# Patient Record
Sex: Female | Born: 1985 | Race: Black or African American | Hispanic: No | Marital: Single | Smoking: Never smoker
Health system: Southern US, Community
[De-identification: ages and names within clinical notes are randomized; demographics above are authoritative.]

## PROBLEM LIST (undated history)

## (undated) DIAGNOSIS — F329 Major depressive disorder, single episode, unspecified: Secondary | ICD-10-CM

## (undated) DIAGNOSIS — F419 Anxiety disorder, unspecified: Secondary | ICD-10-CM

## (undated) DIAGNOSIS — F32A Depression, unspecified: Secondary | ICD-10-CM

## (undated) HISTORY — DX: Anxiety disorder, unspecified: F41.9

## (undated) HISTORY — DX: Depression, unspecified: F32.A

## (undated) HISTORY — DX: Major depressive disorder, single episode, unspecified: F32.9

---
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
## 2007-06-13 ENCOUNTER — Emergency Department (HOSPITAL_COMMUNITY): Admission: EM | Admit: 2007-06-13 | Discharge: 2007-06-13 | Payer: Self-pay | Admitting: Emergency Medicine

## 2008-06-16 ENCOUNTER — Emergency Department (HOSPITAL_COMMUNITY): Admission: EM | Admit: 2008-06-16 | Discharge: 2008-06-16 | Payer: Self-pay | Admitting: Emergency Medicine

## 2009-11-29 ENCOUNTER — Emergency Department (HOSPITAL_COMMUNITY): Admission: EM | Admit: 2009-11-29 | Discharge: 2009-11-29 | Payer: Self-pay | Admitting: Emergency Medicine

## 2010-05-30 ENCOUNTER — Inpatient Hospital Stay (INDEPENDENT_AMBULATORY_CARE_PROVIDER_SITE_OTHER)
Admission: RE | Admit: 2010-05-30 | Discharge: 2010-05-30 | Disposition: A | Discharge status: Home / Self Care | Payer: Self-pay | Source: Ambulatory Visit | Attending: Emergency Medicine | Admitting: Emergency Medicine

## 2010-05-30 DIAGNOSIS — N390 Urinary tract infection, site not specified: Secondary | ICD-10-CM

## 2010-05-30 LAB — POCT URINALYSIS DIPSTICK
Specific Gravity, Urine: 1.015 (ref 1.005–1.030)
pH: 5.5 (ref 5.0–8.0)

## 2010-05-31 LAB — URINE CULTURE: Colony Count: >=100000

## 2010-06-17 LAB — POCT I-STAT, CHEM 8
BUN: 10 mg/dL (ref 6–23)
Calcium, Ion: 1.13 mmol/L (ref 1.12–1.32)
Chloride: 103 mEq/L (ref 96–112)
Creatinine, Ser: 1.2 mg/dL (ref 0.4–1.2)
Glucose, Bld: 101 mg/dL — ABNORMAL HIGH (ref 70–99)
HCT: 43 % (ref 36.0–46.0)
Hemoglobin: 14.6 g/dL (ref 12.0–15.0)
TCO2: 27 mmol/L (ref 0–100)

## 2010-07-14 LAB — POCT RAPID STREP A (OFFICE): Streptococcus, Group A Screen (Direct): NEGATIVE

## 2010-10-03 ENCOUNTER — Inpatient Hospital Stay (INDEPENDENT_AMBULATORY_CARE_PROVIDER_SITE_OTHER)
Admission: RE | Admit: 2010-10-03 | Discharge: 2010-10-03 | Disposition: A | Discharge status: Home / Self Care | Payer: Self-pay | Source: Ambulatory Visit | Attending: Family Medicine | Admitting: Family Medicine

## 2010-10-03 DIAGNOSIS — L02219 Cutaneous abscess of trunk, unspecified: Secondary | ICD-10-CM

## 2010-10-06 LAB — CULTURE, ROUTINE-ABSCESS

## 2010-12-26 LAB — POCT PREGNANCY, URINE
Operator id: 257131
Preg Test, Ur: NEGATIVE

## 2011-05-26 ENCOUNTER — Other Ambulatory Visit: Payer: Self-pay | Admitting: Physician Assistant

## 2011-05-26 ENCOUNTER — Other Ambulatory Visit (HOSPITAL_COMMUNITY)
Admission: RE | Admit: 2011-05-26 | Discharge: 2011-05-26 | Disposition: A | Discharge status: Home / Self Care | Payer: Self-pay | Source: Ambulatory Visit | Attending: Family Medicine | Admitting: Family Medicine

## 2011-05-26 DIAGNOSIS — Z01419 Encounter for gynecological examination (general) (routine) without abnormal findings: Secondary | ICD-10-CM | POA: Insufficient documentation

## 2011-08-29 ENCOUNTER — Encounter (HOSPITAL_COMMUNITY): Payer: Self-pay | Admitting: Emergency Medicine

## 2011-08-29 ENCOUNTER — Emergency Department (INDEPENDENT_AMBULATORY_CARE_PROVIDER_SITE_OTHER)
Admission: EM | Admit: 2011-08-29 | Discharge: 2011-08-29 | Disposition: A | Discharge status: Home / Self Care | Payer: Self-pay | Source: Home / Self Care | Attending: Emergency Medicine | Admitting: Emergency Medicine

## 2011-08-29 DIAGNOSIS — K5289 Other specified noninfective gastroenteritis and colitis: Secondary | ICD-10-CM

## 2011-08-29 DIAGNOSIS — K529 Noninfective gastroenteritis and colitis, unspecified: Secondary | ICD-10-CM

## 2011-08-29 LAB — POCT PREGNANCY, URINE: Preg Test, Ur: NEGATIVE

## 2011-08-29 LAB — POCT URINALYSIS DIP (DEVICE)
Glucose, UA: NEGATIVE mg/dL
Hgb urine dipstick: NEGATIVE
Ketones, ur: NEGATIVE mg/dL
Urobilinogen, UA: 0.2 mg/dL (ref 0.0–1.0)

## 2011-08-29 MED ORDER — ONDANSETRON HCL 4 MG PO TABS: 4.0000 mg | ORAL_TABLET | Freq: Four times a day (QID) | ORAL | Status: AC

## 2011-08-29 NOTE — ED Notes (Signed)
Pt having vomiting and diarrhea since last night after eating chick fil a and having a glass of wine. Pt states she was not able to keep things down, but this morning was able to keep down a little bit of water and pepsi. Pt states it is mostly clear liquid and dry heaves. Pt states she is 2 months late on her period but this has happened in the past and she took a pregnancy test which was negative.

## 2011-08-29 NOTE — Discharge Instructions (Signed)
Clear liquids only until 24 hours after the last time you vomited (so tomorrow morning if you don't throw up again).  Then you can add solid foods that are simple (crackers, plain toast, white rice, applesauce, etc).  Add greasy, high fat, spicy foods and dairy back to your diet last.    Clear Liquid Diet The clear liquid dietconsists of foods that are liquid or will become liquid at room temperature.You should be able to see through the liquid and beverages. Examples of foods allowed on a clear liquid diet include fruit juice, broth or bouillon, gelatin, or frozen ice pops. The purpose of this diet is to provide necessary fluid, electrolytes such as sodium and potassium, and energy to keep the body functioning during times when you are not able to consume a regular diet.A clear liquid diet should not be continued for long periods of time as it is not nutritionally adequate.  REASONS FOR USING A CLEAR LIQUID DIET  In sudden onset (acute) conditions for a patient before or after surgery.   As the first step in oral feeding.   For fluid and electrolyte replacement in diarrheal diseases.   As a diet before certain medical tests are performed.  ADEQUACY The clear liquid diet is adequate only in ascorbic acid, according to the Recommended Dietary Allowances of the [employer]. CHOOSING FOODS Breads and Starches  Allowed:  None are allowed.   Avoid: All are avoided.  Vegetables  Allowed:  Strained tomato or vegetable juice.   Avoid: Any others.  Fruit  Allowed:  Strained fruit juices and fruit drinks. Include 1 serving of citrus or vitamin C-enriched fruit juice daily.   Avoid: Any others.  Meat and Meat Substitutes  Allowed:  None are allowed.   Avoid: All are avoided.  Milk  Allowed:  None are allowed.   Avoid: All are avoided.  Soups and Combination Foods  Allowed:  Clear bouillon, broth, or strained broth-based soups.   Avoid: Any others.  Desserts and  Sweets  Allowed:  Sugar, honey. High protein gelatin. Flavored gelatin, ices, or frozen ice pops that do not contain milk.   Avoid: Any others.  Fats and Oils  Allowed:  None are allowed.   Avoid: All are avoided.  Beverages  Allowed: Cereal beverages, coffee (regular or decaffeinated), tea, or soda at the discretion of your caregiver.   Avoid: Any others.  Condiments  Allowed:  Iodized salt.   Avoid: Any others, including pepper.  Supplements  Allowed:  Liquid nutrition beverages.   Avoid: Any others that contain lactose or fiber.  SAMPLE MEAL PLAN Breakfast  4 oz (120 mL) strained orange juice.    to 1 cup (125 to 250 mL) gelatin (plain or fortified).   1 cup (250 mL) beverage (coffee or tea).   Sugar, if desired.  Midmorning Snack   cup (125 mL) gelatin (plain or fortified).  Lunch  1 cup (250 mL) broth or consomm.   4 oz (120 mL) strained grapefruit juice.    cup (125 mL) gelatin (plain or fortified).   1 cup (250 mL) beverage (coffee or tea).   Sugar, if desired.  Midafternoon Snack   cup (125 mL) fruit ice.    cup (125 mL) strained fruit juice.  Dinner  1 cup (250 mL) broth or consomm.    cup (125 mL) cranberry juice.    cup (125 mL) flavored gelatin (plain or fortified).   1 cup (250 mL) beverage (coffee or tea).  Sugar, if desired.  Evening Snack  4 oz (120 mL) strained apple juice (vitamin C-fortified).    cup (125 mL) flavored gelatin (plain or fortified).  Document Released: 03/20/2005 Document Revised: 03/09/2011 Document Reviewed: 06/17/2010 [facility] Patient Information 2012 [place], [place].Clear Liquid Diet The clear liquid dietconsists of foods that are liquid or will become liquid at room temperature.You should be able to see through the liquid and beverages. Examples of foods allowed on a clear liquid diet include fruit juice, broth or bouillon, gelatin, or frozen ice pops. The purpose of this diet is to provide  necessary fluid, electrolytes such as sodium and potassium, and energy to keep the body functioning during times when you are not able to consume a regular diet.A clear liquid diet should not be continued for long periods of time as it is not nutritionally adequate.  REASONS FOR USING A CLEAR LIQUID DIET  In sudden onset (acute) conditions for a patient before or after surgery.   As the first step in oral feeding.   For fluid and electrolyte replacement in diarrheal diseases.   As a diet before certain medical tests are performed.  ADEQUACY The clear liquid diet is adequate only in ascorbic acid, according to the Recommended Dietary Allowances of the [employer]. CHOOSING FOODS Breads and Starches  Allowed:  None are allowed.   Avoid: All are avoided.  Vegetables  Allowed:  Strained tomato or vegetable juice.   Avoid: Any others.  Fruit  Allowed:  Strained fruit juices and fruit drinks. Include 1 serving of citrus or vitamin C-enriched fruit juice daily.   Avoid: Any others.  Meat and Meat Substitutes  Allowed:  None are allowed.   Avoid: All are avoided.  Milk  Allowed:  None are allowed.   Avoid: All are avoided.  Soups and Combination Foods  Allowed:  Clear bouillon, broth, or strained broth-based soups.   Avoid: Any others.  Desserts and Sweets  Allowed:  Sugar, honey. High protein gelatin. Flavored gelatin, ices, or frozen ice pops that do not contain milk.   Avoid: Any others.  Fats and Oils  Allowed:  None are allowed.   Avoid: All are avoided.  Beverages  Allowed: Cereal beverages, coffee (regular or decaffeinated), tea, or soda at the discretion of your caregiver.   Avoid: Any others.  Condiments  Allowed:  Iodized salt.   Avoid: Any others, including pepper.  Supplements  Allowed:  Liquid nutrition beverages.   Avoid: Any others that contain lactose or fiber.  SAMPLE MEAL PLAN Breakfast  4 oz (120 mL) strained orange  juice.    to 1 cup (125 to 250 mL) gelatin (plain or fortified).   1 cup (250 mL) beverage (coffee or tea).   Sugar, if desired.  Midmorning Snack   cup (125 mL) gelatin (plain or fortified).  Lunch  1 cup (250 mL) broth or consomm.   4 oz (120 mL) strained grapefruit juice.    cup (125 mL) gelatin (plain or fortified).   1 cup (250 mL) beverage (coffee or tea).   Sugar, if desired.  Midafternoon Snack   cup (125 mL) fruit ice.    cup (125 mL) strained fruit juice.  Dinner  1 cup (250 mL) broth or consomm.    cup (125 mL) cranberry juice.    cup (125 mL) flavored gelatin (plain or fortified).   1 cup (250 mL) beverage (coffee or tea).   Sugar, if desired.  Evening Snack  4 oz (120 mL)  strained apple juice (vitamin C-fortified).    cup (125 mL) flavored gelatin (plain or fortified).  Document Released: 03/20/2005 Document Revised: 03/09/2011 Document Reviewed: 06/17/2010 [facility] Patient Information 2012 [place], [place]. B.R.A.T. Diet Your doctor has recommended the B.R.A.T. diet for you or your child until the condition improves. This is often used to help control diarrhea and vomiting symptoms. If you or your child can tolerate clear liquids, you may have:  Bananas.   Rice.   Applesauce.   Toast (and other simple starches such as crackers, potatoes, noodles).  Be sure to avoid dairy products, meats, and fatty foods until symptoms are better. Fruit juices such as apple, grape, and prune juice can make diarrhea worse. Avoid these. Continue this diet for 2 days or as instructed by your caregiver. Document Released: 03/20/2005 Document Revised: 03/09/2011 Document Reviewed: 09/06/2006 [facility] Patient Information 2012 [place], [place].

## 2011-08-29 NOTE — ED Provider Notes (Signed)
History     CSN: [id number]  Arrival date & time 08/29/11  0803   First MD Initiated Contact with Patient 08/29/11 [phone]      Chief Complaint  Patient presents with  . Emesis    (Consider location/radiation/quality/duration/timing/severity/associated sxs/prior treatment) HPI Comments: Pt's grandmother with similar sx earlier this week.  Vomiting and diarrhea since last night at 10pm, last around 4am this morning.  States abd and back are "sore" from vomiting.   Patient is a 26 y.o. female presenting with vomiting and diarrhea. The history is provided by the patient.  Emesis  This is a new problem. The current episode started 6 to 12 hours ago. The problem occurs more than 10 times per day. The problem has been gradually improving. The emesis has an appearance of stomach contents. There has been no fever. Associated symptoms include chills and diarrhea. Pertinent negatives include no abdominal pain and no fever. Risk factors include ill contacts.  Diarrhea The primary symptoms include nausea, vomiting and diarrhea. Primary symptoms do not include fever, abdominal pain or dysuria. The illness began yesterday. The problem has been gradually improving.  The illness is also significant for chills. Significant associated medical issues include GERD.    History reviewed. No pertinent past medical history.  History reviewed. No pertinent past surgical history.  History reviewed. No pertinent family history.  History  Substance Use Topics  . Smoking status: Never Smoker   . Smokeless tobacco: Not on file  . Alcohol Use: Yes     Occasional    OB History    Grav Para Term Preterm Abortions TAB SAB Ect Mult Living                  Review of Systems  Constitutional: Positive for chills and appetite change. Negative for fever.  Gastrointestinal: Positive for nausea, vomiting and diarrhea. Negative for abdominal pain.  Genitourinary: Negative for dysuria.  Neurological: Negative for  dizziness.    Allergies  Review of patient's allergies indicates no known allergies.  Home Medications   Current Outpatient Rx  Name Route Sig Dispense Refill  . ONDANSETRON HCL 4 MG PO TABS Oral Take 1 tablet (4 mg total) by mouth every 6 (six) hours. 12 tablet 0    BP 108/74  Pulse 90  Temp(Src) 98.1 F (36.7 C) (Oral)  Resp 16  SpO2 97%  LMP 06/07/2011  Physical Exam  Constitutional: She appears well-developed and well-nourished. No distress.  Cardiovascular: Normal rate and regular rhythm.   Pulmonary/Chest: Effort normal and breath sounds normal.  Abdominal: Soft. Bowel sounds are normal. She exhibits no distension. There is generalized tenderness. There is no rebound, no guarding and no CVA tenderness.       Mild tender to palp entire abd    ED Course  Procedures (including critical care time)  Labs Reviewed  POCT URINALYSIS DIP (DEVICE) - Abnormal; Notable for the following:    Bilirubin Urine SMALL (*)    Protein, ur 30 (*)    All other components within normal limits  POCT PREGNANCY, URINE   No results found.   1. Gastroenteritis       MDM  Reviewed diet management of sx.         Cathlyn Parsons, NP 08/29/11 [phone]

## 2011-08-31 NOTE — ED Provider Notes (Signed)
Medical screening examination/treatment/procedure(s) were performed by non-physician practitioner and as supervising physician I was immediately available for consultation/collaboration.  Derold Dorsch M. MD   Makel Mcmann M Zubin Pontillo, MD 08/31/11 0832 

## 2011-11-01 ENCOUNTER — Encounter (HOSPITAL_COMMUNITY): Payer: Self-pay | Admitting: Emergency Medicine

## 2011-11-01 DIAGNOSIS — Y93I9 Activity, other involving external motion: Secondary | ICD-10-CM | POA: Insufficient documentation

## 2011-11-01 DIAGNOSIS — Y92410 Unspecified street and highway as the place of occurrence of the external cause: Secondary | ICD-10-CM | POA: Insufficient documentation

## 2011-11-01 DIAGNOSIS — S139XXA Sprain of joints and ligaments of unspecified parts of neck, initial encounter: Secondary | ICD-10-CM | POA: Insufficient documentation

## 2011-11-01 DIAGNOSIS — Y998 Other external cause status: Secondary | ICD-10-CM | POA: Insufficient documentation
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
## 2011-11-01 NOTE — ED Notes (Signed)
C- COLLAR APPLIED AT TRIAGE.  

## 2011-11-02 ENCOUNTER — Emergency Department (HOSPITAL_COMMUNITY): Payer: No Typology Code available for payment source

## 2011-11-02 ENCOUNTER — Emergency Department (HOSPITAL_COMMUNITY)
Admission: EM | Admit: 2011-11-02 | Discharge: 2011-11-02 | Disposition: A | Discharge status: Home / Self Care | Payer: No Typology Code available for payment source | Attending: Emergency Medicine | Admitting: Emergency Medicine

## 2011-11-02 DIAGNOSIS — S161XXA Strain of muscle, fascia and tendon at neck level, initial encounter: Secondary | ICD-10-CM

## 2011-11-02 DIAGNOSIS — T148XXA Other injury of unspecified body region, initial encounter: Secondary | ICD-10-CM

## 2011-11-02 MED ORDER — IBUPROFEN 400 MG PO TABS: 400.0000 mg | ORAL_TABLET | Freq: Four times a day (QID) | ORAL | Status: AC | PRN

## 2011-11-02 MED ORDER — CYCLOBENZAPRINE HCL 10 MG PO TABS: 10.0000 mg | ORAL_TABLET | Freq: Three times a day (TID) | ORAL | Status: AC | PRN
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
## 2011-11-02 NOTE — ED Provider Notes (Signed)
Medical screening examination/treatment/procedure(s) were performed by non-physician practitioner and as supervising physician I was immediately available for consultation/collaboration.    Ruford Dudzinski D Daneka Lantigua, MD 11/02/11 0425 

## 2011-11-02 NOTE — ED Notes (Signed)
The patient is AOx4 and comfortable with her discharge instructions. 

## 2011-11-02 NOTE — ED Notes (Signed)
MD at bedside. 

## 2012-04-11 ENCOUNTER — Emergency Department (INDEPENDENT_AMBULATORY_CARE_PROVIDER_SITE_OTHER)
Admission: EM | Admit: 2012-04-11 | Discharge: 2012-04-11 | Disposition: A | Discharge status: Home / Self Care | Payer: BC Managed Care – PPO | Source: Home / Self Care | Attending: Emergency Medicine | Admitting: Emergency Medicine

## 2012-04-11 ENCOUNTER — Encounter (HOSPITAL_COMMUNITY): Payer: Self-pay | Admitting: Emergency Medicine

## 2012-04-11 DIAGNOSIS — R059 Cough, unspecified: Secondary | ICD-10-CM

## 2012-04-11 DIAGNOSIS — J399 Disease of upper respiratory tract, unspecified: Secondary | ICD-10-CM

## 2012-04-11 DIAGNOSIS — J398 Other specified diseases of upper respiratory tract: Secondary | ICD-10-CM

## 2012-04-11 DIAGNOSIS — R05 Cough: Secondary | ICD-10-CM

## 2012-04-11 DIAGNOSIS — R1110 Vomiting, unspecified: Secondary | ICD-10-CM

## 2012-04-11 LAB — POCT URINALYSIS DIP (DEVICE)
Bilirubin Urine: NEGATIVE
Hgb urine dipstick: NEGATIVE
Leukocytes, UA: NEGATIVE

## 2012-04-11 MED ORDER — GUAIFENESIN-CODEINE 100-10 MG/5ML PO SYRP: 5.0000 mL | ORAL_SOLUTION | Freq: Three times a day (TID) | ORAL | Status: DC | PRN

## 2012-04-11 NOTE — ED Notes (Signed)
Pt c/o cold sx x8 days Sx include: dry cough, back pain, abd pain, vomiting, diarrhea, fevers, chills, loss of appetite Mother dx w/flu  She is alert w/no signs of acute distress.

## 2012-04-11 NOTE — ED Provider Notes (Signed)
History     CSN: [id number]  Arrival date & time 04/11/12  1239   First MD Initiated Contact with Patient 04/11/12 1307      Chief Complaint  Patient presents with  . URI    (Consider location/radiation/quality/duration/timing/severity/associated sxs/prior treatment) HPI Comments: Patient presents urgent care describing for about a week she's been having " flulike symptoms", also describes that her mother was diagnosed with the flu. She has been coughing frequently mainly a dry cough that makes her vomit at times. She initially had couple days with liquidy diarrhea is abdominal cramping that since then have subsided. Have been expressing back pain discomfort fevers and body aches with loss of appetite. Denies any urinary symptoms, such as burning and pressure with urination, denies any shortness of breath. Have not had a fever she feels within the last 2 days. Is still coughing and unable to rest and feeling tired and fatigued from coughing. Making her back hurts  Patient is a 27 y.o. female presenting with URI. The history is provided by the patient.  URI The primary symptoms include fever, fatigue, sore throat, cough, nausea, vomiting and myalgias. Primary symptoms do not include headaches, wheezing, arthralgias or rash. This is a new problem. The problem has not changed since onset. The sore throat is not accompanied by stridor.  Symptoms associated with the illness include chills, congestion and rhinorrhea.    History reviewed. No pertinent past medical history.  History reviewed. No pertinent past surgical history.  History reviewed. No pertinent family history.  History  Substance Use Topics  . Smoking status: Never Smoker   . Smokeless tobacco: Not on file  . Alcohol Use: Yes     Comment: Occasional    OB History    Grav Para Term Preterm Abortions TAB SAB Ect Mult Living                  Review of Systems  Constitutional: Positive for fever, chills, activity change  and fatigue.  HENT: Positive for congestion, sore throat and rhinorrhea. Negative for neck stiffness.   Eyes: Negative for redness.  Respiratory: Positive for cough. Negative for shortness of breath, wheezing and stridor.   Cardiovascular: Negative for chest pain.  Gastrointestinal: Positive for nausea and vomiting.  Genitourinary: Negative for dysuria, hematuria, vaginal discharge and pelvic pain.  Musculoskeletal: Positive for myalgias. Negative for back pain, joint swelling and arthralgias.  Skin: Negative for rash.  Neurological: Negative for dizziness and headaches.    Allergies  Review of patient's allergies indicates no known allergies.  Home Medications   Current Outpatient Rx  Name  Route  Sig  Dispense  Refill  . GUAIFENESIN-CODEINE 100-10 MG/5ML PO SYRP   Oral   Take 5 mLs by mouth 3 (three) times daily as needed for cough.   180 mL   0     BP 121/85  Pulse 75  Temp 98.4 F (36.9 C) (Oral)  Resp 18  SpO2 97%  LMP 03/27/2012  Physical Exam  Nursing note and vitals reviewed. Constitutional: She is oriented to person, place, and time. Vital signs are normal. She appears well-developed.  Non-toxic appearance. She does not have a sickly appearance. She does not appear ill. No distress.  HENT:  Head: Normocephalic and atraumatic.  Mouth/Throat: Oropharyngeal exudate present.  Eyes: Conjunctivae normal are normal. Right eye exhibits no discharge. Left eye exhibits no discharge. No scleral icterus.  Neck: Neck supple. No JVD present.  Cardiovascular: Normal rate.   Pulmonary/Chest: Effort  normal and breath sounds normal. No respiratory distress. She has no decreased breath sounds. She has no wheezes. She has no rhonchi. She has no rales.   She exhibits no tenderness.  Abdominal: Soft. She exhibits no distension and no mass. There is no tenderness. There is no rebound and no guarding.  Lymphadenopathy:    She has no cervical adenopathy.  Neurological: She is alert  and oriented to person, place, and time.  Skin: No rash noted. No erythema.    ED Course  Procedures (including critical care time)  Labs Reviewed  POCT URINALYSIS DIP (DEVICE) - Abnormal; Notable for the following:    pH 8.5 (*)     All other components within normal limits  POCT PREGNANCY, URINE   No results found.   1. Cough   2. Upper respiratory disease   3. Post-tussive vomiting       MDM  Symptoms and exam consistent with a most likely resolving upper infection with residual cough. Patient is afebrile or to stress was recurrent dry cough. Posttussive emesis. Patient was prescribed a cough suppressant. Instructed to return if new symptoms or worsening. Patient agreeable to treatment plan and followup care as necessary.        Jimmie Molly, MD 04/11/12 1430

## 2012-07-24 ENCOUNTER — Emergency Department (HOSPITAL_COMMUNITY)
Admission: EM | Admit: 2012-07-24 | Discharge: 2012-07-24 | Disposition: A | Discharge status: Home / Self Care | Payer: BC Managed Care – PPO | Attending: Emergency Medicine | Admitting: Emergency Medicine

## 2012-07-24 ENCOUNTER — Encounter (HOSPITAL_COMMUNITY): Payer: Self-pay | Admitting: Emergency Medicine

## 2012-07-24 DIAGNOSIS — X500XXA Overexertion from strenuous movement or load, initial encounter: Secondary | ICD-10-CM | POA: Insufficient documentation

## 2012-07-24 DIAGNOSIS — S0993XA Unspecified injury of face, initial encounter: Secondary | ICD-10-CM | POA: Insufficient documentation

## 2012-07-24 DIAGNOSIS — M436 Torticollis: Secondary | ICD-10-CM | POA: Insufficient documentation

## 2012-07-24 DIAGNOSIS — Y9389 Activity, other specified: Secondary | ICD-10-CM | POA: Insufficient documentation

## 2012-07-24 DIAGNOSIS — M62838 Other muscle spasm: Secondary | ICD-10-CM | POA: Insufficient documentation

## 2012-07-24 DIAGNOSIS — Y9289 Other specified places as the place of occurrence of the external cause: Secondary | ICD-10-CM | POA: Insufficient documentation

## 2012-07-24 MED ORDER — METHOCARBAMOL 500 MG PO TABS
1000.0000 mg | ORAL_TABLET | Freq: Once | ORAL | Status: AC
  Administered 2012-07-24: 1000 mg via ORAL
  Filled 2012-07-24: qty 2

## 2012-07-24 MED ORDER — OXYCODONE-ACETAMINOPHEN 5-325 MG PO TABS: 1.0000 | ORAL_TABLET | ORAL | Status: DC | PRN

## 2012-07-24 MED ORDER — METHOCARBAMOL 500 MG PO TABS: 1000.0000 mg | ORAL_TABLET | Freq: Two times a day (BID) | ORAL | Status: DC

## 2012-07-24 MED ORDER — OXYCODONE-ACETAMINOPHEN 5-325 MG PO TABS
1.0000 | ORAL_TABLET | Freq: Once | ORAL | Status: AC
  Administered 2012-07-24: 1 via ORAL
  Filled 2012-07-24: qty 1

## 2012-07-24 NOTE — ED Notes (Signed)
C/o  Right neck pain after turning head this am. No other deficits.

## 2012-07-24 NOTE — ED Notes (Signed)
Pt reports when she turned her neck to the right she felt a sharp pain. Pt reports pain occurs when she tries to look to the right. Pt denies heavy lifting or any other injury. Pt tearful in triage. Pt denies fever.

## 2012-07-24 NOTE — ED Notes (Signed)
Pt requesting pain medicine.

## 2012-07-26 NOTE — ED Provider Notes (Signed)
History     CSN: [id number]  Arrival date & time 07/24/12  1124   First MD Initiated Contact with Patient 07/24/12 1249      Chief Complaint  Patient presents with  . Neck Pain    (Consider location/radiation/quality/duration/timing/severity/associated sxs/prior treatment) HPI Comments: Carly Gray is a 27 y.o. Female with sudden onset of right neck pain and muscle spasm starting this morning when she looked over her right shoulder when sitting at her desk.  She felt a sudden spasm and has had severe pain with movement of her head since.  She denies numbness or weakness in her arms or hands.  Her pain is worse with head rotation to the right and there is radiation of pain into her right scapula. She has taken no medications prior to arrival.     The history is provided by the patient.    History reviewed. No pertinent past medical history.  History reviewed. No pertinent past surgical history.  No family history on file.  History  Substance Use Topics  . Smoking status: Never Smoker   . Smokeless tobacco: Not on file  . Alcohol Use: Yes     Comment: Occasional    OB History   Grav Para Term Preterm Abortions TAB SAB Ect Mult Living                  Review of Systems  Constitutional: Negative for fever and chills.  HENT: Positive for neck pain. Negative for congestion, sore throat and neck stiffness.   Eyes: Negative.   Respiratory: Negative for chest tightness and shortness of breath.   Cardiovascular: Negative for chest pain.  Gastrointestinal: Negative for nausea and abdominal pain.  Genitourinary: Negative.   Musculoskeletal: Negative for back pain, joint swelling and arthralgias.  Skin: Negative.  Negative for rash and wound.  Neurological: Negative for dizziness, weakness, light-headedness, numbness and headaches.  Psychiatric/Behavioral: Negative.     Allergies  Review of patient's allergies indicates no known allergies.  Home Medications    Current Outpatient Rx  Name  Route  Sig  Dispense  Refill  . guaiFENesin-codeine (ROBITUSSIN AC) 100-10 MG/5ML syrup   Oral   Take 5 mLs by mouth 3 (three) times daily as needed for cough.   180 mL   0   . methocarbamol (ROBAXIN) 500 MG tablet   Oral   Take 2 tablets (1,000 mg total) by mouth 2 (two) times daily.   40 tablet   0   . oxyCODONE-acetaminophen (PERCOCET/ROXICET) 5-325 MG per tablet   Oral   Take 1 tablet by mouth every 4 (four) hours as needed for pain.   15 tablet   0     BP 120/71  Pulse 79  Temp(Src) 98.5 F (36.9 C) (Oral)  Resp 18  SpO2 99%  LMP 06/28/2012  Physical Exam  Nursing note and vitals reviewed. Constitutional: She is oriented to person, place, and time. She appears well-developed and well-nourished.  HENT:  Head: Normocephalic.  Eyes: Conjunctivae are normal.  Neck: Neck supple. Muscular tenderness present. No spinous process tenderness present. No edema and no erythema present.    Minimal pain with neck flexion and extension.  Pain worse with right rotation,  Less so with left rotation.  Right trapezius muscle spasm.  Cardiovascular: Normal rate and intact distal pulses.   Pulses:      Radial pulses are 2+ on the right side, and 2+ on the left side.  Pedal pulses normal.  Pulmonary/Chest:  Effort normal.  Abdominal: Soft. Bowel sounds are normal. She exhibits no distension and no mass.  Musculoskeletal: Normal range of motion. She exhibits no edema.       Lumbar back: She exhibits tenderness. She exhibits no swelling, no edema and no spasm.  Neurological: She is alert and oriented to person, place, and time. She has normal strength. She displays no atrophy and no tremor. No sensory deficit. Gait normal.  Reflex Scores:      Bicep reflexes are 2+ on the right side and 2+ on the left side. Equal grip strength  Skin: Skin is warm and dry.  Psychiatric: She has a normal mood and affect.    ED Course  Procedures (including critical  care time)  Labs Reviewed - No data to display No results found.   1. Torticollis, acute       MDM  Pt with reproducible pain and right trapezius muscle spasm.  She was prescribed oxycodone and robaxin,  First doses given here with starting improvement of sx at time of dc.  She was encouraged heat tx, 20 minutes 3 times daily followed by ROM,  Demonstrated.  Recheck if not improving over the next several days.  The patient appears reasonably screened and/or stabilized for discharge and I doubt any other medical condition or other [facility] requiring further screening, evaluation, or treatment in the ED at this time prior to discharge.         Burgess Amor, PA-C 07/26/12 1436

## 2012-07-30 NOTE — ED Provider Notes (Signed)
Medical screening examination/treatment/procedure(s) were performed by non-physician practitioner and as supervising physician I was immediately available for consultation/collaboration.   Celvin Taney L Adreyan Carbajal, MD 07/30/12 0720 

## 2015-06-01 ENCOUNTER — Encounter: Payer: Self-pay | Admitting: Obstetrics and Gynecology

## 2015-06-01 ENCOUNTER — Ambulatory Visit (INDEPENDENT_AMBULATORY_CARE_PROVIDER_SITE_OTHER): Payer: 59 | Admitting: Obstetrics and Gynecology

## 2015-06-01 VITALS — BP 120/80 | HR 80 | Resp 15 | Ht 63.5 in | Wt 198.0 lb

## 2015-06-01 DIAGNOSIS — F329 Major depressive disorder, single episode, unspecified: Secondary | ICD-10-CM

## 2015-06-01 DIAGNOSIS — R112 Nausea with vomiting, unspecified: Secondary | ICD-10-CM | POA: Diagnosis not present

## 2015-06-01 DIAGNOSIS — Z0000 Encounter for general adult medical examination without abnormal findings: Secondary | ICD-10-CM | POA: Diagnosis not present

## 2015-06-01 DIAGNOSIS — R5383 Other fatigue: Secondary | ICD-10-CM

## 2015-06-01 DIAGNOSIS — M546 Pain in thoracic spine: Secondary | ICD-10-CM | POA: Diagnosis not present

## 2015-06-01 DIAGNOSIS — F411 Generalized anxiety disorder: Secondary | ICD-10-CM

## 2015-06-01 DIAGNOSIS — N62 Hypertrophy of breast: Secondary | ICD-10-CM | POA: Diagnosis not present

## 2015-06-01 DIAGNOSIS — Z01419 Encounter for gynecological examination (general) (routine) without abnormal findings: Secondary | ICD-10-CM

## 2015-06-01 DIAGNOSIS — Z8659 Personal history of other mental and behavioral disorders: Secondary | ICD-10-CM

## 2015-06-01 DIAGNOSIS — Z124 Encounter for screening for malignant neoplasm of cervix: Secondary | ICD-10-CM | POA: Diagnosis not present

## 2015-06-01 DIAGNOSIS — Z113 Encounter for screening for infections with a predominantly sexual mode of transmission: Secondary | ICD-10-CM | POA: Diagnosis not present

## 2015-06-01 DIAGNOSIS — F32A Depression, unspecified: Secondary | ICD-10-CM

## 2015-06-01 DIAGNOSIS — M549 Dorsalgia, unspecified: Secondary | ICD-10-CM

## 2015-06-01 MED ORDER — CITALOPRAM HYDROBROMIDE 20 MG PO TABS: ORAL_TABLET | ORAL | Status: DC

## 2015-06-01 NOTE — Addendum Note (Signed)
Addended by: Shelda Jakes E on: 06/01/2015 11:53 AM   Modules accepted: Orders

## 2015-06-01 NOTE — Progress Notes (Signed)
Patient ID: Carly Gray, female   DOB: [birth year], 30 y.o.   MRN: [id number] 30 y.o. G0P0000 SingleAfrican AmericanF here for annual exam. [name] severe anxiety. She has a h/o bulemia in high school. She is now vomiting multiple x a day. She vomits every time she eats, not intentional. In the past she used marjuana, it helped the vomiting. Her anxiety is severe and can cause vomiting. This has been a problem for the last 10 years. She can't travel, she is always anxious. She hasn't seen a therapist. She feels tired. She wants to work out. She has back pain from her large breast, it prevents her from exercising.  She would like to have kids, not actively trying to get pregnant. Intermittently using condoms. Lives with her boyfriend, together x 2 years.  Period Cycle (Days): 28 Period Duration (Days): 3 days  Period Pattern: Regular Menstrual Flow: Moderate, Heavy Menstrual Control: Maxi pad Dysmenorrhea: (!) Severe Dysmenorrhea Symptoms: Cramping  Saturates a pad in 3-4 hours. Excedrin helps with her cramps  Patient's last menstrual period was 05/18/2015.          Sexually active: Yes.    The current method of family planning is none.    Exercising: No.  The patient does not participate in regular exercise at present. Smoker:  no  Health Maintenance: Pap:  05-26-11 WNL History of abnormal Pap:  no TDaP:  unsure Gardasil: No   reports that she has never smoked. She has never used smokeless tobacco. She reports that she drinks alcohol. She reports that she does not use illicit drugs.Drinks 3 x a month. She works at [employer], works in Herbal[employer]  Past Medical History  Diagnosis Date  . Depression   . Anxiety     History reviewed. No pertinent past surgical history.  No current outpatient prescriptions on file.   No current facility-administered medications for this visit.    Family History  Problem Relation Age of Onset  . Hypertension Maternal Grandfather   . Hyperlipidemia  Maternal Grandfather     Review of Systems  Constitutional: Negative.   HENT: Negative.   Eyes: Negative.   Respiratory: Negative.   Cardiovascular: Negative.   Gastrointestinal: Positive for vomiting.       Heart burn  Endocrine: Negative.   Genitourinary: Negative.   Musculoskeletal: Positive for back pain.  Skin: Negative.   Allergic/Immunologic: Negative.   Neurological: Negative.   Psychiatric/Behavioral: Negative.        Sever anxiety   Low energy  Exam:   BP 120/80 mmHg  Pulse 80  Resp 15  Ht 5' 3.5" (1.613 m)  Wt 198 lb (89.812 kg)  BMI 34.52 kg/m2  LMP 05/18/2015  Weight change: @ Height:   Height: 5' 3.5" (161.3 cm)  Ht Readings from Last 3 Encounters:  06/01/15 5' 3.5" (1.613 m)    General appearance: alert, cooperative and appears stated age Head: Normocephalic, without obvious abnormality, atraumatic Neck: no adenopathy, supple, symmetrical, trachea midline and thyroid normal to inspection and palpation Lungs: clear to auscultation bilaterally Breasts: normal appearance, no masses or tenderness, large and pendulous. She has marks from her bra on her sides and shoulders Heart: regular rate and rhythm Abdomen: soft, non-tender; bowel sounds normal; no masses,  no organomegaly Extremities: extremities normal, atraumatic, no cyanosis or edema Skin: Skin color, texture, turgor normal. No rashes or lesions Lymph nodes: Cervical, supraclavicular, and axillary nodes normal. No abnormal inguinal nodes palpated Neurologic: Grossly normal   Pelvic: External genitalia:  no lesions              Urethra:  normal appearing urethra with no masses, tenderness or lesions              Bartholins and Skenes: normal                 Vagina: normal appearing vagina with normal color and discharge, no lesions              Cervix: no lesions               Bimanual Exam:  Uterus:  normal size, contour, position, consistency, mobility, non-tender, anteverted               Adnexa: no mass, fullness, tenderness               Rectovaginal: Confirms               Anus:  normal sphincter tone, no lesions  Chaperone was present for exam.  A:  Well Woman with normal exam  H/O Bulemia, now has uncontrolled nausea and vomiting  Anxiety, depression  Back pain from large breasts  Fatigue  Overweight, desires weight loss  P:   Start Celexa  F/U in 1 month  F/U with GI  Will refer to a therapist, names and numbers given  Pap with reflex hpv  Refer to plastic for possible breast reduction  Refer to GI   STD testing  CBC, CMP, TSH, vit D  Discussed breast self exam  Discussed exercise and weight watchers   CC: Dr Etter Sjogren, Dr Charna Elizabeth

## 2015-06-02 LAB — HEPATITIS C ANTIBODY: Hep C Virus Ab: <0.1 s/co ratio (ref 0.0–0.9)

## 2015-06-02 LAB — CBC
HEMOGLOBIN: 13.7 g/dL (ref 11.1–15.9)
Hematocrit: 39.7 % (ref 34.0–46.6)
MCH: 31.4 pg (ref 26.6–33.0)
MCHC: 34.5 g/dL (ref 31.5–35.7)
MCV: 91 fL (ref 79–97)
Platelets: 295 10*3/uL (ref 150–379)
RBC: 4.36 x10E6/uL (ref 3.77–5.28)
RDW: 13.7 % (ref 12.3–15.4)
WBC: 4.1 10*3/uL (ref 3.4–10.8)

## 2015-06-02 LAB — HIV ANTIBODY (ROUTINE TESTING W REFLEX): HIV Screen 4th Generation wRfx: NONREACTIVE

## 2015-06-02 LAB — COMPREHENSIVE METABOLIC PANEL
ALBUMIN: 4.3 g/dL (ref 3.5–5.5)
ALK PHOS: 67 IU/L (ref 39–117)
ALT: 14 IU/L (ref 0–32)
AST: 21 IU/L (ref 0–40)
Albumin/Globulin Ratio: 1.8 (ref 1.1–2.5)
BILIRUBIN TOTAL: 0.5 mg/dL (ref 0.0–1.2)
BUN / CREAT RATIO: 10 (ref 8–20)
BUN: 10 mg/dL (ref 6–20)
CO2: 23 mmol/L (ref 18–29)
CREATININE: 1.01 mg/dL — AB (ref 0.57–1.00)
Calcium: 9.1 mg/dL (ref 8.7–10.2)
Chloride: 101 mmol/L (ref 96–106)
GFR calc Af Amer: 88 mL/min/{1.73_m2} (ref >59)
GFR calc non Af Amer: 76 mL/min/{1.73_m2} (ref >59)
GLOBULIN, TOTAL: 2.4 g/dL (ref 1.5–4.5)
Glucose: 87 mg/dL (ref 65–99)
Potassium: 3.9 mmol/L (ref 3.5–5.2)
SODIUM: 139 mmol/L (ref 134–144)
Total Protein: 6.7 g/dL (ref 6.0–8.5)

## 2015-06-02 LAB — LIPID PANEL
CHOL/HDL RATIO: 4 ratio (ref 0.0–4.4)
CHOLESTEROL TOTAL: 261 mg/dL — AB (ref 100–199)
HDL: 65 mg/dL (ref >39)
LDL Calculated: 164 mg/dL — ABNORMAL HIGH (ref 0–99)
TRIGLYCERIDES: 159 mg/dL — AB (ref 0–149)
VLDL Cholesterol Cal: 32 mg/dL (ref 5–40)

## 2015-06-02 LAB — VITAMIN D 25 HYDROXY (VIT D DEFICIENCY, FRACTURES): Vit D, 25-Hydroxy: 6.8 ng/mL — ABNORMAL LOW (ref 30.0–100.0)

## 2015-06-02 LAB — HEPATITIS B SURFACE ANTIGEN: HEP B S AG: NEGATIVE

## 2015-06-02 LAB — RPR: RPR Ser Ql: NONREACTIVE

## 2015-06-02 LAB — TSH: TSH: 1.41 u[IU]/mL (ref 0.450–4.500)

## 2015-06-03 ENCOUNTER — Other Ambulatory Visit: Payer: Self-pay | Admitting: *Deleted

## 2015-06-03 LAB — PAP LB, RFX HPV ASCU: PAP SMEAR COMMENT: 0

## 2015-06-03 MED ORDER — VITAMIN D (ERGOCALCIFEROL) 1.25 MG (50000 UNIT) PO CAPS: 50000.0000 [IU] | ORAL_CAPSULE | ORAL | Status: AC

## 2015-06-05 LAB — GC/CHLAMYDIA PROBE AMP
CHLAMYDIA, DNA PROBE: NEGATIVE
NEISSERIA GONORRHOEAE BY PCR: NEGATIVE

## 2015-06-07 ENCOUNTER — Telehealth: Payer: Self-pay | Admitting: Obstetrics and Gynecology

## 2015-06-07 NOTE — Telephone Encounter (Signed)
Spoke with patient to convey appointment information for an appt with Dr Mann. AdvisedLoreta Gray patient scheduled for 06/10/15 at 2:00 arrive 20 minutes early to get checked in. I provided Dr Kenna GilbertMann's address and phone number, as well. Patient is agreeable to this information. Ok to close encounter

## 2015-06-08 ENCOUNTER — Telehealth: Payer: Self-pay | Admitting: *Deleted

## 2015-06-08 NOTE — Telephone Encounter (Signed)
See result note-eh 

## 2015-06-08 NOTE — Telephone Encounter (Signed)
LMTC in regards to lab results -eh 

## 2015-06-08 NOTE — Telephone Encounter (Signed)
-----   Message from Romualdo BolkJill Evelyn Jertson, MD sent at 06/07/2015  4:57 PM EST ----- Please advise the patient of normal results.

## 2015-06-10 ENCOUNTER — Other Ambulatory Visit: Payer: Self-pay | Admitting: Gastroenterology

## 2015-06-10 DIAGNOSIS — R112 Nausea with vomiting, unspecified: Secondary | ICD-10-CM

## 2015-06-28 ENCOUNTER — Encounter (HOSPITAL_COMMUNITY)
Admission: RE | Admit: 2015-06-28 | Discharge: 2015-06-28 | Disposition: A | Discharge status: Home / Self Care | Payer: 59 | Source: Ambulatory Visit | Attending: Gastroenterology | Admitting: Gastroenterology

## 2015-06-28 ENCOUNTER — Ambulatory Visit (HOSPITAL_COMMUNITY)
Admission: RE | Admit: 2015-06-28 | Discharge: 2015-06-28 | Disposition: A | Discharge status: Home / Self Care | Payer: 59 | Source: Ambulatory Visit | Attending: Gastroenterology | Admitting: Gastroenterology

## 2015-06-28 DIAGNOSIS — N281 Cyst of kidney, acquired: Secondary | ICD-10-CM | POA: Insufficient documentation

## 2015-06-28 DIAGNOSIS — R932 Abnormal findings on diagnostic imaging of liver and biliary tract: Secondary | ICD-10-CM | POA: Insufficient documentation

## 2015-06-28 DIAGNOSIS — R112 Nausea with vomiting, unspecified: Secondary | ICD-10-CM | POA: Diagnosis present

## 2015-06-28 MED ORDER — TECHNETIUM TC 99M MEBROFENIN IV KIT: 5.0000 | PACK | Freq: Once | INTRAVENOUS | Status: DC | PRN

## 2015-06-30 ENCOUNTER — Ambulatory Visit (INDEPENDENT_AMBULATORY_CARE_PROVIDER_SITE_OTHER): Payer: 59 | Admitting: Obstetrics and Gynecology

## 2015-06-30 ENCOUNTER — Encounter: Payer: Self-pay | Admitting: Obstetrics and Gynecology

## 2015-06-30 VITALS — BP 100/74 | HR 64 | Resp 14 | Wt 194.0 lb

## 2015-06-30 DIAGNOSIS — F411 Generalized anxiety disorder: Secondary | ICD-10-CM | POA: Diagnosis not present

## 2015-06-30 DIAGNOSIS — M25512 Pain in left shoulder: Secondary | ICD-10-CM

## 2015-06-30 DIAGNOSIS — M25511 Pain in right shoulder: Secondary | ICD-10-CM | POA: Diagnosis not present

## 2015-06-30 DIAGNOSIS — E559 Vitamin D deficiency, unspecified: Secondary | ICD-10-CM | POA: Diagnosis not present

## 2015-06-30 DIAGNOSIS — R112 Nausea with vomiting, unspecified: Secondary | ICD-10-CM

## 2015-06-30 NOTE — Progress Notes (Signed)
Patient ID: Carly Gray, female   DOB: 02/29/1986, 30 y.o.   MRN: [id number] GYNECOLOGY  VISIT   HPI: 30 y.o.   Single  African American  female   G0P0000 with Patient's last menstrual period was 06/14/2015.   Here to follow up on medication. She took the celexa x 2 weeks, didn't see any result so she stopped it. She felt sleepy, didn't feel any better.  She has seen Dr Loreta AveMann for nausea and emesis, normal testing. She has a f/u with Dr Loreta AveMann on 07/08/15. She broke up with her boyfriend 2 weeks ago, stress and anxiety have improved since she isn't with him. She is vomiting less. Last emesis was 3 days ago. Previously was vomiting every day.  She was given names of counselors last time, hasn't made an appointment yet.  She has lower back pain and shoulder pain and some upper back pain. She feels it is from her large breasts and desires breast reduction.   GYNECOLOGIC HISTORY: Patient's last menstrual period was 06/14/2015. Contraception:none  Menopausal hormone therapy: none         OB History    Gravida Para Term Preterm AB TAB SAB Ectopic Multiple Living   0 0 0 0 0 0 0 0 0 0          There are no active problems to display for this patient.   Past Medical History  Diagnosis Date  . Depression   . Anxiety     History reviewed. No pertinent past surgical history.  Current Outpatient Prescriptions  Medication Sig Dispense Refill  . Vitamin D, Ergocalciferol, (DRISDOL) 50000 units CAPS capsule Take 1 capsule (50,000 Units total) by mouth every 7 (seven) days. 12 capsule 0  . citalopram (CELEXA) 20 MG tablet Take 1/2 a tablet a day for the first week, then increase to one tablet a day (Patient not taking: Reported on 06/30/2015) 30 tablet 1   No current facility-administered medications for this visit.   Facility-Administered Medications Ordered in Other Visits  Medication Dose Route Frequency Provider Last Rate Last Dose  . technetium TC 19M mebrofenin (CHOLETEC) injection 5 milli  Curie  5 milli Curie Intravenous Once PRN Charna ElizabethJyothi Mann, MD         ALLERGIES: Review of patient's allergies indicates no known allergies.  Family History  Problem Relation Age of Onset  . Hypertension Maternal Grandfather   . Hyperlipidemia Maternal Grandfather     Social History   Social History  . Marital Status: Single    Spouse Name: N/A  . Number of Children: N/A  . Years of Education: N/A   Occupational History  . Not on file.   Social History Main Topics  . Smoking status: Never Smoker   . Smokeless tobacco: Never Used  . Alcohol Use: 0.0 oz/week    0 Standard drinks or equivalent per week     Comment: Occasional  . Drug Use: No  . Sexual Activity:    Partners: Male    Birth Control/ Protection: Condom   Other Topics Concern  . Not on file   Social History Narrative    Review of Systems  Constitutional: Negative.   HENT: Negative.   Eyes: Negative.   Respiratory: Negative.   Cardiovascular: Negative.   Gastrointestinal: Negative.   Genitourinary: Negative.   Musculoskeletal: Negative.   Skin: Negative.   Neurological: Negative.   Endo/Heme/Allergies: Negative.   Psychiatric/Behavioral:       Anxiety     PHYSICAL EXAMINATION:  BP 100/74 mmHg  Pulse 64  Resp 14  Wt 194 lb (87.998 kg)  LMP 06/14/2015    General appearance: alert, cooperative and appears stated age  ASSESSMENT Anxiety, some improvement with breaking up with her boyfriend Only tried the Celexa x 2 weeks Vomiting has improved, still occuring Back pain/shoulder pain from large breasts Vit D Def, she is on 50,000 IU a week Abnormal lipids, needs to establish care with a primary   PLAN She will retry the celexa x 4 weeks, she will call at that point to check in F/U with Dr Loreta Ave Encouraged therapy F/U with plastic surgeon about breast reduction Continue vit D weekly, then f/u vit D level   An After Visit Summary was printed and given to the patient.  15 minutes face to  face time of which over 50% was spent in counseling.

## 2015-08-26 ENCOUNTER — Other Ambulatory Visit: Payer: Self-pay

## 2015-08-28 ENCOUNTER — Other Ambulatory Visit: Payer: Self-pay | Admitting: Obstetrics and Gynecology

## 2015-08-28 DIAGNOSIS — E559 Vitamin D deficiency, unspecified: Secondary | ICD-10-CM

## 2015-08-31 NOTE — Telephone Encounter (Signed)
Please see if the patient needs more prior to her visit, if so call in 30 tablets.

## 2015-08-31 NOTE — Telephone Encounter (Signed)
Medication refill request: Citalopram 20mg  Last AEX:  06/01/15 JJ; 06/30/15 for Anxiety state Next AEX: next appt scheduled for 09/02/15 Last MMG (if hormonal medication request): n/a Refill authorized: 06/01/15 #30 w/1 refill; today please advise patient has appt here this week (09/02/15)

## 2015-09-01 NOTE — Telephone Encounter (Signed)
Left message to call Kaitlyn at [phone]. 

## 2015-09-02 ENCOUNTER — Other Ambulatory Visit (INDEPENDENT_AMBULATORY_CARE_PROVIDER_SITE_OTHER): Payer: 59

## 2015-09-02 ENCOUNTER — Other Ambulatory Visit: Payer: Self-pay | Admitting: Emergency Medicine

## 2015-09-02 DIAGNOSIS — E559 Vitamin D deficiency, unspecified: Secondary | ICD-10-CM

## 2015-09-02 DIAGNOSIS — R5383 Other fatigue: Secondary | ICD-10-CM

## 2015-09-02 MED ORDER — CITALOPRAM HYDROBROMIDE 20 MG PO TABS: 20.0000 mg | ORAL_TABLET | Freq: Every day | ORAL | Status: AC

## 2015-09-02 NOTE — Telephone Encounter (Signed)
Patient came to office today for Vitamin D recheck.  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 She is given a written prescription for serum vitamin D redraw as she has completed her prescription Vitamin D. She will take this to [employer]. Attempted to enter in lab order electronically as well.   Patient is currently on Celexa 20 mg po daily.  She states she is feeling less anxiety but would like to discuss with Dr. Oscar LaJertson.  [facility].  Office visit with Dr. Oscar LaJertson scheduled for follow up 09/16/15 at 1600., Patient agreeable.  Routing to provider for final review. Patient agreeable to disposition. Will close encounter.

## 2015-09-14 NOTE — Progress Notes (Signed)
Patient has an appointment this week- will recheck vitamin D -eh

## 2015-09-16 ENCOUNTER — Telehealth: Payer: Self-pay | Admitting: Obstetrics and Gynecology

## 2015-09-16 ENCOUNTER — Ambulatory Visit: Payer: 59 | Admitting: Obstetrics and Gynecology

## 2015-09-16 NOTE — Telephone Encounter (Signed)
Patient called and cancelled her appointment for today with Dr. Oscar LaJertson for a "medication follow up" due to a "death in the family." She said she will call back to reschedule at a later day.

## 2015-10-18 NOTE — Telephone Encounter (Signed)
Left patient a message to call back to reschedule

## 2015-11-10 NOTE — Telephone Encounter (Signed)
The patient will call if she wants to be seen. Will close the encounter.

## 2015-11-10 NOTE — Telephone Encounter (Signed)
Called and left patient a message to call back to reschedule this cancelled appointment. To date, she has not rescheduled.   Okay to close encounter or is further follow up needed?

## 2015-11-30 ENCOUNTER — Other Ambulatory Visit: Payer: Self-pay | Admitting: Obstetrics and Gynecology

## 2015-11-30 NOTE — Telephone Encounter (Signed)
Medication refill request: Citalopram Last AEX:  06/01/15 JJ Next AEX: Not scheduled  Last MMG (if hormonal medication request): n/a Refill authorized: 09/02/15 #30 1R. Please advise. Thank you.

## 2015-11-30 NOTE — Telephone Encounter (Signed)
Patient requested Citalopram refills to pharmacy on file.

## 2015-11-30 NOTE — Telephone Encounter (Signed)
My original plan was to have her come in to f/u on the anxiety and celexa. Initially she had some issues with it. Please check on how she is doing, I don't know if she is on the right dose.  We certainly can send in some Celexa while she is making an appointment to get in. Or if she feels great on it, you can fill it until her annual exam

## 2015-12-01 NOTE — Telephone Encounter (Signed)
Spoke with patient- made her an appointment with Dr. Oscar LaJertson for 12-03-15. She would like a one time refill to hold her over  eh

## 2015-12-03 ENCOUNTER — Encounter: Payer: Self-pay | Admitting: Obstetrics and Gynecology

## 2015-12-03 ENCOUNTER — Telehealth: Payer: Self-pay | Admitting: Obstetrics and Gynecology

## 2015-12-03 ENCOUNTER — Ambulatory Visit: Payer: Self-pay | Admitting: Obstetrics and Gynecology

## 2015-12-03 NOTE — Telephone Encounter (Signed)
Patient dnka her "follow up on Celexa" appointment this morning. I left a message for her to call and reschedule. FYI: This appointment was scheduled two days ago. [facility] followed.

## 2016-05-04 ENCOUNTER — Encounter: Payer: Self-pay | Admitting: Obstetrics and Gynecology

## 2017-02-10 IMAGING — NM NM HEPATO W/GB/PHARM/[PERSON_NAME]
3 series · 13 of 13 positions shown · non-contrast
Comparison: None.

CLINICAL DATA: Nausea and vomiting.

EXAM:
NUCLEAR MEDICINE HEPATOBILIARY IMAGING WITH GALLBLADDER EF
TECHNIQUE: Sequential images of the abdomen were obtained [DATE] minutes
following intravenous administration of radiopharmaceutical. After
oral ingestion of Ensure, gallbladder ejection fraction was
determined. At 60 min, normal ejection fraction is greater than 33%.
RADIOPHARMACEUTICALS:  5.0 mCi Uc-SSm  Choletec IV

[he hepatobiliary · 2.51mm/px · 1 of 1 slices shown]
[im 1/1]
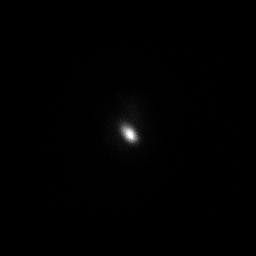

[anterior · 3.43mm/px · 6 of 60 frames shown]
[frame 6/60]
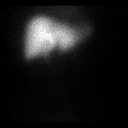
[frame 16/60]
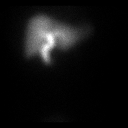
[frame 26/60]
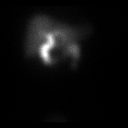
[frame 36/60]
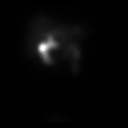
[frame 46/60]
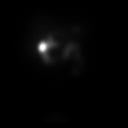
[frame 56/60]
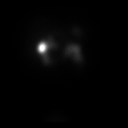

[gb ef · 3.43mm/px · 6 of 60 frames shown]
[frame 6/60]
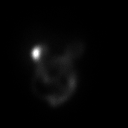
[frame 16/60]
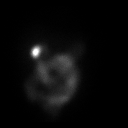
[frame 26/60]
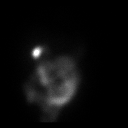
[frame 36/60]
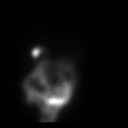
[frame 46/60]
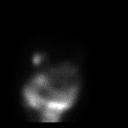
[frame 56/60]
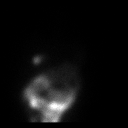

[13 of 13 positions shown; findings below may reference images not displayed]

FINDINGS: Liver, biliary system, gallbladder, bowel visualize normally.
Gallbladder ejection fraction: 96%. Normal gallbladder ejection
fraction with Ensure is greater than 33%.
IMPRESSION: Normal exam.
